# Patient Record
Sex: Female | Born: 1960 | Race: White | Hispanic: No | Marital: Married | Smoking: Never smoker
Health system: Southern US, Community
[De-identification: ages and names within clinical notes are randomized; demographics above are authoritative.]

## PROBLEM LIST (undated history)

## (undated) DIAGNOSIS — N289 Disorder of kidney and ureter, unspecified: Secondary | ICD-10-CM

## (undated) DIAGNOSIS — I10 Essential (primary) hypertension: Secondary | ICD-10-CM

## (undated) DIAGNOSIS — E785 Hyperlipidemia, unspecified: Secondary | ICD-10-CM

## (undated) DIAGNOSIS — I2510 Atherosclerotic heart disease of native coronary artery without angina pectoris: Secondary | ICD-10-CM

## (undated) DIAGNOSIS — R079 Chest pain, unspecified: Secondary | ICD-10-CM

---

## 2013-01-29 ENCOUNTER — Inpatient Hospital Stay: Payer: Self-pay | Admitting: Internal Medicine

## 2013-01-29 LAB — COMPREHENSIVE METABOLIC PANEL
Calcium, Total: 9.3 mg/dL (ref 8.5–10.1)
Chloride: 104 mmol/L (ref 98–107)
Co2: 28 mmol/L (ref 21–32)
Creatinine: 1.06 mg/dL (ref 0.60–1.30)
EGFR (African American): >60
EGFR (Non-African Amer.): >60
Osmolality: 280 (ref 275–301)
Potassium: 3.3 mmol/L — ABNORMAL LOW (ref 3.5–5.1)
SGPT (ALT): 60 U/L (ref 12–78)
Sodium: 139 mmol/L (ref 136–145)

## 2013-01-29 LAB — TSH: Thyroid Stimulating Horm: 0.496 u[IU]/mL

## 2013-01-29 LAB — URINALYSIS, COMPLETE
Bacteria: NONE SEEN
Bilirubin,UR: NEGATIVE
Glucose,UR: NEGATIVE mg/dL (ref 0–75)
Ketone: NEGATIVE
Leukocyte Esterase: NEGATIVE
Nitrite: NEGATIVE
Ph: 7 (ref 4.5–8.0)
Squamous Epithelial: <1

## 2013-01-29 LAB — TROPONIN I
Troponin-I: 0.03 ng/mL
Troponin-I: 0.07 ng/mL — ABNORMAL HIGH
Troponin-I: 0.47 ng/mL — ABNORMAL HIGH
Troponin-I: 1 ng/mL — ABNORMAL HIGH

## 2013-01-29 LAB — CBC
HCT: 36.6 % (ref 35.0–47.0)
MCHC: 31.2 g/dL — ABNORMAL LOW (ref 32.0–36.0)
MCV: 74 fL — ABNORMAL LOW (ref 80–100)
Platelet: 343 10*3/uL (ref 150–440)
RBC: 4.97 10*6/uL (ref 3.80–5.20)
RDW: 18 % — ABNORMAL HIGH (ref 11.5–14.5)
WBC: 11.5 10*3/uL — ABNORMAL HIGH (ref 3.6–11.0)

## 2013-01-29 LAB — CK-MB
CK-MB: 6.9 ng/mL — ABNORMAL HIGH (ref 0.5–3.6)
CK-MB: 7.5 ng/mL — ABNORMAL HIGH (ref 0.5–3.6)

## 2013-01-29 LAB — LIPID PANEL
Cholesterol: 119 mg/dL (ref 0–200)
HDL Cholesterol: 58 mg/dL (ref 40–60)
Triglycerides: 63 mg/dL (ref 0–200)

## 2013-01-29 LAB — HEMOGLOBIN A1C: Hemoglobin A1C: 5.7 % (ref 4.2–6.3)

## 2013-01-29 LAB — PRO B NATRIURETIC PEPTIDE: B-Type Natriuretic Peptide: 1601 pg/mL — ABNORMAL HIGH (ref 0–125)

## 2013-01-29 LAB — CK TOTAL AND CKMB (NOT AT ARMC): CK-MB: 4.3 ng/mL — ABNORMAL HIGH (ref 0.5–3.6)

## 2013-01-30 LAB — MAGNESIUM: Magnesium: 1.6 mg/dL — ABNORMAL LOW

## 2013-01-30 LAB — POTASSIUM: Potassium: 4.1 mmol/L (ref 3.5–5.1)

## 2013-01-30 LAB — OCCULT BLOOD X 1 CARD TO LAB, STOOL: Occult Blood, Feces: NEGATIVE

## 2013-01-31 LAB — CBC WITH DIFFERENTIAL/PLATELET
Basophil #: 0 10*3/uL (ref 0.0–0.1)
HCT: 29.2 % — ABNORMAL LOW (ref 35.0–47.0)
Lymphocyte %: 35 %
MCH: 23.4 pg — ABNORMAL LOW (ref 26.0–34.0)
MCHC: 32.1 g/dL (ref 32.0–36.0)
Monocyte %: 8 %
Neutrophil %: 51.3 %
RDW: 17.8 % — ABNORMAL HIGH (ref 11.5–14.5)
WBC: 6.9 10*3/uL (ref 3.6–11.0)

## 2013-02-01 HISTORY — PX: CARDIAC CATHETERIZATION: SHX172

## 2013-02-01 LAB — CBC WITH DIFFERENTIAL/PLATELET
Basophil #: 0.1 10*3/uL (ref 0.0–0.1)
Basophil %: 0.9 %
Eosinophil #: 0.3 10*3/uL (ref 0.0–0.7)
HGB: 10.4 g/dL — ABNORMAL LOW (ref 12.0–16.0)
MCHC: 32.9 g/dL (ref 32.0–36.0)
MCV: 73 fL — ABNORMAL LOW (ref 80–100)
Monocyte #: 0.6 x10 3/mm (ref 0.2–0.9)
Monocyte %: 9.1 %
Neutrophil #: 3.7 10*3/uL (ref 1.4–6.5)
Neutrophil %: 54.6 %
Platelet: 333 10*3/uL (ref 150–440)
RBC: 4.34 10*6/uL (ref 3.80–5.20)

## 2013-02-01 LAB — BASIC METABOLIC PANEL
Anion Gap: 4 — ABNORMAL LOW (ref 7–16)
BUN: 26 mg/dL — ABNORMAL HIGH (ref 7–18)
Chloride: 104 mmol/L (ref 98–107)
Co2: 29 mmol/L (ref 21–32)
Creatinine: 1.39 mg/dL — ABNORMAL HIGH (ref 0.60–1.30)
EGFR (African American): 51 — ABNORMAL LOW
Glucose: 89 mg/dL (ref 65–99)
Osmolality: 278 (ref 275–301)
Sodium: 137 mmol/L (ref 136–145)

## 2013-02-01 LAB — HCG, QUANTITATIVE, PREGNANCY: Beta Hcg, Quant.: <1 m[IU]/mL — ABNORMAL LOW

## 2013-02-02 LAB — BASIC METABOLIC PANEL
Calcium, Total: 9.3 mg/dL (ref 8.5–10.1)
Chloride: 102 mmol/L (ref 98–107)
Creatinine: 1.39 mg/dL — ABNORMAL HIGH (ref 0.60–1.30)
EGFR (African American): 51 — ABNORMAL LOW
Glucose: 95 mg/dL (ref 65–99)
Potassium: 4.6 mmol/L (ref 3.5–5.1)
Sodium: 136 mmol/L (ref 136–145)

## 2013-02-03 ENCOUNTER — Encounter (HOSPITAL_COMMUNITY): Payer: Self-pay | Admitting: Emergency Medicine

## 2013-02-03 ENCOUNTER — Emergency Department (HOSPITAL_COMMUNITY): Payer: Self-pay

## 2013-02-03 ENCOUNTER — Inpatient Hospital Stay (HOSPITAL_COMMUNITY)
Admission: EM | Admit: 2013-02-03 | Discharge: 2013-02-06 | DRG: 683 | Disposition: A | Discharge status: Home / Self Care | Payer: Self-pay | Attending: Cardiology | Admitting: Cardiology

## 2013-02-03 DIAGNOSIS — I10 Essential (primary) hypertension: Secondary | ICD-10-CM | POA: Diagnosis present

## 2013-02-03 DIAGNOSIS — N189 Chronic kidney disease, unspecified: Secondary | ICD-10-CM | POA: Diagnosis present

## 2013-02-03 DIAGNOSIS — N289 Disorder of kidney and ureter, unspecified: Secondary | ICD-10-CM | POA: Diagnosis present

## 2013-02-03 DIAGNOSIS — E785 Hyperlipidemia, unspecified: Secondary | ICD-10-CM | POA: Diagnosis present

## 2013-02-03 DIAGNOSIS — I2510 Atherosclerotic heart disease of native coronary artery without angina pectoris: Secondary | ICD-10-CM | POA: Diagnosis present

## 2013-02-03 DIAGNOSIS — I238 Other current complications following acute myocardial infarction: Secondary | ICD-10-CM | POA: Diagnosis present

## 2013-02-03 DIAGNOSIS — Z7982 Long term (current) use of aspirin: Secondary | ICD-10-CM

## 2013-02-03 DIAGNOSIS — Z9119 Patient's noncompliance with other medical treatment and regimen: Secondary | ICD-10-CM

## 2013-02-03 DIAGNOSIS — I214 Non-ST elevation (NSTEMI) myocardial infarction: Secondary | ICD-10-CM | POA: Diagnosis present

## 2013-02-03 DIAGNOSIS — I129 Hypertensive chronic kidney disease with stage 1 through stage 4 chronic kidney disease, or unspecified chronic kidney disease: Principal | ICD-10-CM | POA: Diagnosis present

## 2013-02-03 DIAGNOSIS — Z91199 Patient's noncompliance with other medical treatment and regimen due to unspecified reason: Secondary | ICD-10-CM

## 2013-02-03 DIAGNOSIS — Z79899 Other long term (current) drug therapy: Secondary | ICD-10-CM

## 2013-02-03 DIAGNOSIS — R079 Chest pain, unspecified: Secondary | ICD-10-CM | POA: Diagnosis present

## 2013-02-03 HISTORY — DX: Atherosclerotic heart disease of native coronary artery without angina pectoris: I25.10

## 2013-02-03 HISTORY — DX: Essential (primary) hypertension: I10

## 2013-02-03 HISTORY — DX: Disorder of kidney and ureter, unspecified: N28.9

## 2013-02-03 HISTORY — DX: Chest pain, unspecified: R07.9

## 2013-02-03 HISTORY — DX: Hyperlipidemia, unspecified: E78.5

## 2013-02-03 LAB — POCT I-STAT, CHEM 8
Calcium, Ion: 1.24 mmol/L — ABNORMAL HIGH (ref 1.12–1.23)
Creatinine, Ser: 1.6 mg/dL — ABNORMAL HIGH (ref 0.50–1.10)
HCT: 36 % (ref 36.0–46.0)
Hemoglobin: 12.2 g/dL (ref 12.0–15.0)
Potassium: 4 mEq/L (ref 3.5–5.1)
Sodium: 136 mEq/L (ref 135–145)

## 2013-02-03 NOTE — ED Notes (Signed)
According to EMS, she was treated at [facility] for a NSTEMI they cathed her and sent her home.  Was treated with Nitro and discharged.  Today she started having chest pain so she took 324mg  of Aspirin and 4 Nitro over an hour.  The first three helped and the last one did not so she called EMS.  EMS did 12LEad showed NSR, they placed an IV, #20 to the right forearm and gave her 4mg  of Nitro SL and it went form 6/10 to 3/10.  They transported her here.

## 2013-02-04 ENCOUNTER — Encounter (HOSPITAL_COMMUNITY): Payer: Self-pay | Admitting: Physician Assistant

## 2013-02-04 DIAGNOSIS — I10 Essential (primary) hypertension: Secondary | ICD-10-CM

## 2013-02-04 DIAGNOSIS — R079 Chest pain, unspecified: Secondary | ICD-10-CM | POA: Insufficient documentation

## 2013-02-04 DIAGNOSIS — I2510 Atherosclerotic heart disease of native coronary artery without angina pectoris: Secondary | ICD-10-CM | POA: Diagnosis present

## 2013-02-04 DIAGNOSIS — E785 Hyperlipidemia, unspecified: Secondary | ICD-10-CM | POA: Diagnosis present

## 2013-02-04 DIAGNOSIS — N289 Disorder of kidney and ureter, unspecified: Secondary | ICD-10-CM | POA: Diagnosis present

## 2013-02-04 LAB — CBC WITH DIFFERENTIAL/PLATELET
Basophils Absolute: 0 10*3/uL (ref 0.0–0.1)
Eosinophils Absolute: 0.2 10*3/uL (ref 0.0–0.7)
Eosinophils Relative: 2 % (ref 0–5)
Hemoglobin: 10.4 g/dL — ABNORMAL LOW (ref 12.0–15.0)
Lymphs Abs: 1.6 10*3/uL (ref 0.7–4.0)
MCH: 23.4 pg — ABNORMAL LOW (ref 26.0–34.0)
MCV: 75.1 fL — ABNORMAL LOW (ref 78.0–100.0)
Monocytes Absolute: 0.4 10*3/uL (ref 0.1–1.0)
Neutrophils Relative %: 77 % (ref 43–77)
Platelets: 406 10*3/uL — ABNORMAL HIGH (ref 150–400)
RBC: 4.45 MIL/uL (ref 3.87–5.11)
RDW: 17.2 % — ABNORMAL HIGH (ref 11.5–15.5)
WBC: 9.5 10*3/uL (ref 4.0–10.5)

## 2013-02-04 LAB — POCT I-STAT TROPONIN I: Troponin i, poc: 0.13 ng/mL (ref 0.00–0.08)

## 2013-02-04 LAB — CBC
HCT: 33.7 % — ABNORMAL LOW (ref 36.0–46.0)
MCHC: 31.8 g/dL (ref 30.0–36.0)
MCV: 74.1 fL — ABNORMAL LOW (ref 78.0–100.0)
Platelets: 381 10*3/uL (ref 150–400)
RBC: 4.55 MIL/uL (ref 3.87–5.11)
WBC: 8.5 10*3/uL (ref 4.0–10.5)

## 2013-02-04 LAB — BASIC METABOLIC PANEL
CO2: 24 mEq/L (ref 19–32)
Chloride: 104 mEq/L (ref 96–112)
GFR calc Af Amer: 60 mL/min — ABNORMAL LOW (ref >90)
Sodium: 138 mEq/L (ref 135–145)

## 2013-02-04 LAB — MRSA PCR SCREENING: MRSA by PCR: NEGATIVE

## 2013-02-04 LAB — PROTIME-INR
INR: 1.07 (ref 0.00–1.49)
Prothrombin Time: 13.7 seconds (ref 11.6–15.2)

## 2013-02-04 LAB — TROPONIN I: Troponin I: <0.3 ng/mL (ref <0.30)

## 2013-02-04 LAB — HEMOGLOBIN A1C: Hgb A1c MFr Bld: 5.6 % (ref <5.7)

## 2013-02-04 LAB — MAGNESIUM: Magnesium: 1.8 mg/dL (ref 1.5–2.5)

## 2013-02-04 MED ORDER — HYDROCHLOROTHIAZIDE 25 MG PO TABS
25.0000 mg | ORAL_TABLET | Freq: Every day | ORAL | Status: DC
  Filled 2013-02-04: qty 1

## 2013-02-04 MED ORDER — SODIUM CHLORIDE 0.9 % IJ SOLN: 3.0000 mL | INTRAMUSCULAR | Status: DC | PRN

## 2013-02-04 MED ORDER — LISINOPRIL 40 MG PO TABS: 40.0000 mg | ORAL_TABLET | Freq: Every day | ORAL | Status: DC

## 2013-02-04 MED ORDER — ACETAMINOPHEN 325 MG PO TABS: 650.0000 mg | ORAL_TABLET | ORAL | Status: DC | PRN

## 2013-02-04 MED ORDER — SODIUM CHLORIDE 0.9 % IV BOLUS (SEPSIS)
500.0000 mL | Freq: Once | INTRAVENOUS | Status: AC
  Administered 2013-02-04: 500 mL via INTRAVENOUS

## 2013-02-04 MED ORDER — SODIUM CHLORIDE 0.9 % IJ SOLN
3.0000 mL | Freq: Two times a day (BID) | INTRAMUSCULAR | Status: DC
  Administered 2013-02-04 – 2013-02-06 (×5): 3 mL via INTRAVENOUS

## 2013-02-04 MED ORDER — HYDRALAZINE HCL 20 MG/ML IJ SOLN
10.0000 mg | Freq: Four times a day (QID) | INTRAMUSCULAR | Status: DC | PRN
  Administered 2013-02-05: 10 mg via INTRAVENOUS
  Filled 2013-02-04: qty 1

## 2013-02-04 MED ORDER — PANTOPRAZOLE SODIUM 40 MG PO TBEC
40.0000 mg | DELAYED_RELEASE_TABLET | Freq: Every day | ORAL | Status: DC
  Administered 2013-02-04 – 2013-02-06 (×3): 40 mg via ORAL
  Filled 2013-02-04 (×3): qty 1

## 2013-02-04 MED ORDER — SODIUM CHLORIDE 0.9 % IV SOLN: 250.0000 mL | INTRAVENOUS | Status: DC | PRN

## 2013-02-04 MED ORDER — HEPARIN SODIUM (PORCINE) 5000 UNIT/ML IJ SOLN
5000.0000 [IU] | Freq: Three times a day (TID) | INTRAMUSCULAR | Status: DC
  Administered 2013-02-04 – 2013-02-06 (×7): 5000 [IU] via SUBCUTANEOUS
  Filled 2013-02-04 (×11): qty 1

## 2013-02-04 MED ORDER — LISINOPRIL 40 MG PO TABS
40.0000 mg | ORAL_TABLET | Freq: Every day | ORAL | Status: DC
  Filled 2013-02-04: qty 1

## 2013-02-04 MED ORDER — LISINOPRIL-HYDROCHLOROTHIAZIDE 20-12.5 MG PO TABS: 2.0000 | ORAL_TABLET | Freq: Every day | ORAL | Status: DC

## 2013-02-04 MED ORDER — GI COCKTAIL ~~LOC~~
30.0000 mL | Freq: Once | ORAL | Status: AC
  Administered 2013-02-04: 30 mL via ORAL
  Filled 2013-02-04: qty 30

## 2013-02-04 MED ORDER — ATORVASTATIN CALCIUM 20 MG PO TABS
20.0000 mg | ORAL_TABLET | Freq: Every day | ORAL | Status: DC
  Administered 2013-02-04 – 2013-02-06 (×3): 20 mg via ORAL
  Filled 2013-02-04 (×3): qty 1

## 2013-02-04 MED ORDER — FENTANYL CITRATE 0.05 MG/ML IJ SOLN
50.0000 ug | Freq: Once | INTRAMUSCULAR | Status: AC
  Administered 2013-02-04: 50 ug via INTRAVENOUS
  Filled 2013-02-04: qty 2

## 2013-02-04 MED ORDER — METOPROLOL TARTRATE 25 MG PO TABS
25.0000 mg | ORAL_TABLET | Freq: Two times a day (BID) | ORAL | Status: DC
  Administered 2013-02-04 (×2): 25 mg via ORAL
  Filled 2013-02-04 (×4): qty 1

## 2013-02-04 MED ORDER — ASPIRIN 325 MG PO TABS
325.0000 mg | ORAL_TABLET | Freq: Every day | ORAL | Status: DC
  Administered 2013-02-04 – 2013-02-06 (×3): 325 mg via ORAL
  Filled 2013-02-04 (×3): qty 1

## 2013-02-04 MED ORDER — HYDRALAZINE HCL 20 MG/ML IJ SOLN
5.0000 mg | Freq: Once | INTRAMUSCULAR | Status: AC
  Administered 2013-02-04: 5 mg via INTRAVENOUS
  Filled 2013-02-04: qty 1

## 2013-02-04 MED ORDER — HYDROCHLOROTHIAZIDE 25 MG PO TABS: 25.0000 mg | ORAL_TABLET | Freq: Every day | ORAL | Status: DC

## 2013-02-04 MED ORDER — ONDANSETRON HCL 4 MG/2ML IJ SOLN: 4.0000 mg | Freq: Four times a day (QID) | INTRAMUSCULAR | Status: DC | PRN

## 2013-02-04 MED ORDER — ALPRAZOLAM 0.25 MG PO TABS: 0.2500 mg | ORAL_TABLET | Freq: Two times a day (BID) | ORAL | Status: DC | PRN

## 2013-02-04 MED ORDER — LISINOPRIL 20 MG PO TABS
20.0000 mg | ORAL_TABLET | Freq: Every day | ORAL | Status: DC
  Administered 2013-02-04 – 2013-02-06 (×3): 20 mg via ORAL
  Filled 2013-02-04 (×3): qty 1

## 2013-02-04 MED ORDER — NITROGLYCERIN IN D5W 200-5 MCG/ML-% IV SOLN
2.0000 ug/min | INTRAVENOUS | Status: DC
  Administered 2013-02-04: 2 ug/min via INTRAVENOUS
  Filled 2013-02-04: qty 250

## 2013-02-04 MED ORDER — AMLODIPINE BESYLATE 5 MG PO TABS
5.0000 mg | ORAL_TABLET | Freq: Every day | ORAL | Status: DC
  Administered 2013-02-04 – 2013-02-06 (×3): 5 mg via ORAL
  Filled 2013-02-04 (×3): qty 1

## 2013-02-04 NOTE — ED Notes (Signed)
I stat troponin results given to Dr. Nicanor Alcon by B. Bing Plume, EMT

## 2013-02-04 NOTE — ED Notes (Signed)
Family at bedside. 

## 2013-02-04 NOTE — Progress Notes (Signed)
Patient Name: [name] Date of Encounter: 02/04/2013   Active Problems:   Chest pain   Renal insufficiency   Uncontrolled hypertension   CAD (coronary artery disease)   Hyperlipidemia    SUBJECTIVE  No chest pain. On nitro ggt. No SOB, diaphoresis, abdominal pain, n/v.  Admits to lightheadedness and dizziness.  CURRENT MEDS . aspirin  325 mg Oral Daily  . atorvastatin  20 mg Oral Daily  . heparin  5,000 Units Subcutaneous Q8H  . lisinopril  40 mg Oral Daily   And  . hydrochlorothiazide  25 mg Oral Daily  . metoprolol tartrate  25 mg Oral BID  . pantoprazole  40 mg Oral Daily  . sodium chloride  3 mL Intravenous Q12H    OBJECTIVE  Filed Vitals:   02/04/13 0600 02/04/13 0700 02/04/13 0715 02/04/13 0723  BP: 154/79 145/97 169/77   Pulse: 80 78 77   Temp:    99.2 F (37.3 C)  TempSrc:    Oral  Resp: 15 14 12    Height:      Weight:      SpO2: 99% 95% 96%     Intake/Output Summary (Last 24 hours) at 02/04/13 0754 Last data filed at 02/04/13 0746  Gross per 24 hour  Intake      3 ml  Output    600 ml  Net   -597 ml   Filed Weights   02/04/13 0311  Weight: 151 lb 14.4 oz (68.9 kg)    PHYSICAL EXAM  General appearance: alert, cooperative, appears stated age and mild distress  Head: Normocephalic, without obvious abnormality, atraumatic  Eyes: conjunctivae/corneas clear. PERRL, EOM's intact. Fundi benign.  Neck: no carotid bruit, no JVD and supple, symmetrical, trachea midline  Lungs: clear to auscultation bilaterally  Chest wall: no tenderness  Heart: regular rate and rhythm, S1, S2 normal, no murmurs or rubs detected  Abdomen: soft, non-tender; bowel sounds normal; no masses, no organomegaly  Extremities: extremities normal, atraumatic, no cyanosis or edema  Pulses: 2+ and symmetric  Neurologic: Grossly normal  Accessory Clinical Findings  CBC  Recent Labs  02/03/13 2347 02/03/13 2352 02/04/13 0649  WBC 9.5  --  8.5  NEUTROABS 7.3  --    --   HGB 10.4* 12.2 10.7*  HCT 33.4* 36.0 33.7*  MCV 75.1*  --  74.1*  PLT 406*  --  381   Basic Metabolic Panel  Recent Labs  02/03/13 2352  NA 136  K 4.0  CL 98  GLUCOSE 122*  BUN 24*  CREATININE 1.60*   Liver Function Tests No results found for this basename: AST, ALT, ALKPHOS, BILITOT, PROT, ALBUMIN,  in the last 72 hours No results found for this basename: LIPASE, AMYLASE,  in the last 72 hours Cardiac Enzymes  Recent Labs  02/04/13 0245  TROPONINI <0.30   TELE  NSR HR 81  ECG  HR 84, prolonged Qt, non specific T wave abnormality.   Radiology/Studies  Dg Chest 2 View  02/04/2013   CLINICAL DATA:  Mid chest pain; recent cardiac catheterization.  EXAM: CHEST  2 VIEW  COMPARISON:  Chest radiograph performed 01/29/2013  FINDINGS: The lungs are well-aerated and clear. There is no evidence of focal opacification, pleural effusion or pneumothorax.  The heart is borderline enlarged; the mediastinal contour is within normal limits. No acute osseous abnormalities are seen.  IMPRESSION: No acute cardiopulmonary process seen; borderline cardiomegaly.    ASSESSMENT AND PLAN Kaedence Connelly is a 52  y.o. female who presented last night for evaluation of chest pain. She was discharged 3 days ago from [facility] after NSTEMI on 12/8. She had cardiac catherization revealing mid LAD 50%, otherwise all vessels patent. She presented to ED last night complaining of chest pain and was found to be in hypertensive urgency (highest BP recorded 205/107).   1. Chest pain- none currently -- Troponin neg x1, continue to cycle -- Recent NSTEMI with Cath on 02/01/13 with 50% stenosis in LAD, otherwise patent vessels -- EKG with no acute ST or TW changes -- On nitro gtt for hypertensive urgency  -- Monitor on telemetry  -- Continue statin and aspirin  2. Hypertensive urgency  -- BP still elevated 196/89, ordered hydralazine IV 10 q6h for systolic BP >170 mm Hg -- On NTG gtt. Wean off  when stabilized on home meds -- She has a history of uncontrolled htn due to non-compliance (2 years). Stressed importance of taking her medicines  -- Lisinopril/HCTZ on hold for now due to elevated Cr.  3. Renal insufficiency (Cr 1.6) -- Hydrate and continue to monitor renal function -- Lisinopril/HCTZ on hold for now  4. HLD -- Continue statin  Signed, STERN, KATHRYN PA-C  History and all data above reviewed.  Patient examined.  I agree with the findings as above.  Minimal chest pain now. No objective evidence of ischemia.  I reviewed the EKG.  he patient exam reveals COR:RRR  ,  Lungs: Clear  ,  Abd: Positive bowel sounds, no rebound no guarding, Ext No edema  .  All available labs, radiology testing, previous records reviewed. Agree with documented assessment and plan. Chest pain:  I do not suspect an ACS and no repeat cath is indicated.  I will review the records and cath report from [place].  HTN:  She reports that she was taking her meds.  She says she has never had a work up of secondary causes.  I will check aldosterone level, VMA and metanephrines.  I will order renal artery Doppler to exclude something like fibromuscular dysplasia.   I will restart ACE without HCTZ.  I will start Norvasc.  Follow BMET with mildly elevated creat on admission.    Rollene Rotunda  9:50 AM  02/04/2013

## 2013-02-04 NOTE — Progress Notes (Signed)
*  PRELIMINARY RESULTS* Vascular Ultrasound Renal Artery Duplex has been completed.  Preliminary findings:   No obvious evidence of renal artery stenosis bilaterally. Symmetrical kidney size.   Farrel Demark, RDMS, RVT  02/04/2013, 3:29 PM

## 2013-02-04 NOTE — H&P (Signed)
Cardiology History and Physical  No PCP Per Patient  History of Present Illness (and review of medical records): [name] is a 52 y.o. female who presents for evaluation of chest pain.  Of note patient was recent discharged from [facility] after NSTEMI 12/8.  She had cardiac catherization revealing mid LAD 50%, otherwise all vessels patent.  She had 2D Echo revealing EF 50-55%, LVH, grade 1 diastolic dysfunction, mild TR and septal HK.  She has of poorly controlled HTN and HLD.  She developed severe chest pain around 930pm.  Pain was midsternal and then radiated to across to both sides.  Pain was 11/10.  She took 3 NTG at home with no relief.  EMS was called and she then took ASA.  She had NTG again by EMS.  Upon arrival BP with systolics 190s.  Pain is now 2/10 and BP remains high after Hydralazine, fentanyl, and GI cocktail.  POC  Troponin is 0.13.  Previous diagnostic testing for coronary artery disease includes: cardiac catheterization and echocardiogram. Previous history of cardiac disease includes Chest Pain Coronary Artery Disease MI. Coronary artery disease risk factors include: dyslipidemia and hypertension. Patient denies history of coronary artery stent.  Review of Systems Pertinent items are noted in HPI. Further review of systems was otherwise negative other than stated in HPI.  Patient Active Problem List   Diagnosis Date Noted  . Chest pain with high risk for cardiac etiology 02/04/2013   Past Medical History  Diagnosis Date  . Hypertension   . Hyperlipidemia     Past Surgical History  Procedure Laterality Date  . Cardiac catheterization  02/01/2013    Prescriptions prior to admission  Medication Sig Dispense Refill  . aspirin 325 MG tablet Take 325 mg by mouth daily.      Marland Kitchen atorvastatin (LIPITOR) 20 MG tablet Take 20 mg by mouth daily.      Marland Kitchen esomeprazole (NEXIUM) 20 MG capsule Take 20 mg by mouth daily at 12 noon.      Marland Kitchen lisinopril-hydrochlorothiazide  (PRINZIDE,ZESTORETIC) 20-12.5 MG per tablet Take 2 tablets by mouth daily.      . metoprolol tartrate (LOPRESSOR) 25 MG tablet Take 25 mg by mouth 2 (two) times daily.      . nitroGLYCERIN (NITROSTAT) 0.4 MG SL tablet Place 0.4 mg under the tongue every 5 (five) minutes as needed for chest pain.      Marland Kitchen triamcinolone (NASACORT ALLERGY 24HR) 55 MCG/ACT AERO nasal inhaler Place 2 sprays into the nose daily.       Allergies  Allergen Reactions  . Cephalexin     hives    History  Substance Use Topics  . Smoking status: Never Smoker   . Smokeless tobacco: Never Used  . Alcohol Use: No    History reviewed. No pertinent family history.   Objective:  Patient Vitals for the past 8 hrs:  BP Temp Temp src Pulse Resp SpO2 Height Weight  02/04/13 0311 167/79 mmHg - - 81 18 99 % 5' (1.524 m) 68.9 kg (151 lb 14.4 oz)  02/04/13 0300 161/89 mmHg 98.1 F (36.7 C) Oral 78 - - - -  02/04/13 0246 132/74 mmHg - - - - - - -  02/04/13 0246 142/82 mmHg - - - - - - -  02/04/13 0219 88/45 mmHg - - 65 11 97 % - -  02/04/13 0215 79/46 mmHg - - 65 13 95 % - -  02/04/13 0200 136/65 mmHg - - 71 14 95 % - -  02/04/13 0154 148/84 mmHg - - 72 9 95 % - -  02/04/13 0130 194/99 mmHg - - 75 19 98 % - -  02/04/13 0120 169/89 mmHg - - 69 22 93 % - -  02/04/13 0115 169/89 mmHg - - 71 18 94 % - -  02/04/13 0100 184/82 mmHg - - 68 14 96 % - -  02/04/13 0045 186/94 mmHg - - 77 18 92 % - -  02/04/13 0030 183/84 mmHg - - 72 - 94 % - -  02/04/13 0015 176/92 mmHg - - 72 - 94 % - -  02/04/13 0000 205/107 mmHg - - 80 - 100 % - -  02/03/13 2330 178/87 mmHg - - 75 16 95 % - -  02/03/13 2311 - - - - - 100 % - -   General appearance: alert, cooperative, appears stated age and mild distress Head: Normocephalic, without obvious abnormality, atraumatic Eyes: conjunctivae/corneas clear. PERRL, EOM's intact. Fundi benign. Neck: no carotid bruit, no JVD and supple, symmetrical, trachea midline Lungs: clear to auscultation  bilaterally Chest wall: no tenderness Heart: regular rate and rhythm, S1, S2 normal, no murmurs or rubs detected Abdomen: soft, non-tender; bowel sounds normal; no masses,  no organomegaly Extremities: extremities normal, atraumatic, no cyanosis or edema Pulses: 2+ and symmetric Neurologic: Grossly normal  Results for orders placed during the hospital encounter of 02/03/13 (from the past 48 hour(s))  CBC WITH DIFFERENTIAL     Status: Abnormal   Collection Time    02/03/13 11:47 PM      Result Value Range   WBC 9.5  4.0 - 10.5 K/uL   RBC 4.45  3.87 - 5.11 MIL/uL   Hemoglobin 10.4 (*) 12.0 - 15.0 g/dL   HCT 96.0 (*) 45.4 - 09.8 %   MCV 75.1 (*) 78.0 - 100.0 fL   MCH 23.4 (*) 26.0 - 34.0 pg   MCHC 31.1  30.0 - 36.0 g/dL   RDW 11.9 (*) 14.7 - 82.9 %   Platelets 406 (*) 150 - 400 K/uL   Neutrophils Relative % 77  43 - 77 %   Neutro Abs 7.3  1.7 - 7.7 K/uL   Lymphocytes Relative 17  12 - 46 %   Lymphs Abs 1.6  0.7 - 4.0 K/uL   Monocytes Relative 5  3 - 12 %   Monocytes Absolute 0.4  0.1 - 1.0 K/uL   Eosinophils Relative 2  0 - 5 %   Eosinophils Absolute 0.2  0.0 - 0.7 K/uL   Basophils Relative 0  0 - 1 %   Basophils Absolute 0.0  0.0 - 0.1 K/uL  POCT I-STAT, CHEM 8     Status: Abnormal   Collection Time    02/03/13 11:52 PM      Result Value Range   Sodium 136  135 - 145 mEq/L   Potassium 4.0  3.5 - 5.1 mEq/L   Chloride 98  96 - 112 mEq/L   BUN 24 (*) 6 - 23 mg/dL   Creatinine, Ser 5.62 (*) 0.50 - 1.10 mg/dL   Glucose, Bld 130 (*) 70 - 99 mg/dL   Calcium, Ion 8.65 (*) 1.12 - 1.23 mmol/L   TCO2 24  0 - 100 mmol/L   Hemoglobin 12.2  12.0 - 15.0 g/dL   HCT 78.4  69.6 - 29.5 %  POCT I-STAT TROPONIN I     Status: Abnormal   Collection Time    02/03/13 11:52 PM  Result Value Range   Troponin i, poc 0.13 (*) 0.00 - 0.08 ng/mL   Comment NOTIFIED PHYSICIAN     Comment 3            Comment: Due to the release kinetics of cTnI,     a negative result within the first hours      of the onset of symptoms does not rule out     myocardial infarction with certainty.     If myocardial infarction is still suspected,     repeat the test at appropriate intervals.  TROPONIN I     Status: None   Collection Time    02/04/13  2:45 AM      Result Value Range   Troponin I <0.30  <0.30 ng/mL   Comment:            Due to the release kinetics of cTnI,     a negative result within the first hours     of the onset of symptoms does not rule out     myocardial infarction with certainty.     If myocardial infarction is still suspected,     repeat the test at appropriate intervals.   Dg Chest 2 View  02/04/2013   CLINICAL DATA:  Mid chest pain; recent cardiac catheterization.  EXAM: CHEST  2 VIEW  COMPARISON:  Chest radiograph performed 01/29/2013  FINDINGS: The lungs are well-aerated and clear. There is no evidence of focal opacification, pleural effusion or pneumothorax.  The heart is borderline enlarged; the mediastinal contour is within normal limits. No acute osseous abnormalities are seen.  IMPRESSION: No acute cardiopulmonary process seen; borderline cardiomegaly.   Electronically Signed   By: Roanna Raider M.D.   On: 02/04/2013 00:38    ECG:  Sinus rhythm HR 76, LVH, no prior to compare, no acute ischemic changes  Assessment: Chest pain, recent NSTEMI s/p cath with mid LAD 50% HTN urgency HLD Renal insufficiency  Plan: 1. Cardiology Admission  2. Continuous monitoring on Telemetry. 3. Repeat ekg on admit, prn chest pain or arrythmia 4. Trend cardiac biomarkers 5. BP control with NTG gtt.  Add home meds and wean off as tolerated. 6. Continue ASA, Statin. No anticoagulation at this time. 7.  Monitor renal function

## 2013-02-04 NOTE — ED Provider Notes (Signed)
CSN: [id number]     Arrival date & time 02/03/13  2311 History   First MD Initiated Contact with Patient 02/03/13 2333     Chief Complaint  Patient presents with  . Chest Pain   (Consider location/radiation/quality/duration/timing/severity/associated sxs/prior Treatment) Patient is a 52 y.o. female presenting with chest pain. The history is provided by the patient.  Chest Pain Pain location:  Substernal area Pain quality: dull   Pain radiates to:  Does not radiate Pain severity:  Moderate Onset quality:  Gradual Timing:  Intermittent Progression:  Unchanged Chronicity:  Recurrent Context: at rest   Relieved by:  Nothing Ineffective treatments:  Nitroglycerin Associated symptoms: shortness of breath   Associated symptoms: no fever, no palpitations and not vomiting     Past Medical History  Diagnosis Date  . Hypertension   . Hyperlipidemia    Past Surgical History  Procedure Laterality Date  . Cardiac catheterization  02/01/2013   History reviewed. No pertinent family history. History  Substance Use Topics  . Smoking status: Never Smoker   . Smokeless tobacco: Never Used  . Alcohol Use: No   OB History   Grav Para Term Preterm Abortions TAB SAB Ect Mult Living                 Review of Systems  Constitutional: Negative for fever.  Respiratory: Positive for shortness of breath.   Cardiovascular: Positive for chest pain. Negative for palpitations and leg swelling.  Gastrointestinal: Negative for vomiting.  All other systems reviewed and are negative.    Allergies  Cephalexin  Home Medications   Current Outpatient Rx  Name  Route  Sig  Dispense  Refill  . aspirin 325 MG tablet   Oral   Take 325 mg by mouth daily.         Marland Kitchen atorvastatin (LIPITOR) 20 MG tablet   Oral   Take 20 mg by mouth daily.         Marland Kitchen esomeprazole (NEXIUM) 20 MG capsule   Oral   Take 20 mg by mouth daily at 12 noon.         Marland Kitchen lisinopril-hydrochlorothiazide  (PRINZIDE,ZESTORETIC) 20-12.5 MG per tablet   Oral   Take 2 tablets by mouth daily.         . metoprolol tartrate (LOPRESSOR) 25 MG tablet   Oral   Take 25 mg by mouth 2 (two) times daily.         . nitroGLYCERIN (NITROSTAT) 0.4 MG SL tablet   Sublingual   Place 0.4 mg under the tongue every 5 (five) minutes as needed for chest pain.         Marland Kitchen triamcinolone (NASACORT ALLERGY 24HR) 55 MCG/ACT AERO nasal inhaler   Nasal   Place 2 sprays into the nose daily.          BP 169/89  Pulse 69  Resp 22  SpO2 93%  LMP 01/28/2013 Physical Exam  Constitutional: She is oriented to person, place, and time. She appears well-developed and well-nourished.  HENT:  Head: Normocephalic and atraumatic.  Mouth/Throat: Oropharynx is clear and moist.  Eyes: Conjunctivae are normal. Pupils are equal, round, and reactive to light.  Neck: Normal range of motion. Neck supple.  Cardiovascular: Normal rate, regular rhythm and intact distal pulses.   Pulmonary/Chest: Effort normal and breath sounds normal. She has no wheezes. She has no rales.  Abdominal: Soft. Bowel sounds are normal. There is no tenderness. There is no rebound and no guarding.  Musculoskeletal: Normal range of motion.  Neurological: She is alert and oriented to person, place, and time.  Skin: Skin is warm and dry. She is not diaphoretic.  Psychiatric: She has a normal mood and affect.    ED Course  Procedures (including critical care time) Labs Review Labs Reviewed  CBC WITH DIFFERENTIAL - Abnormal; Notable for the following:    Hemoglobin 10.4 (*)    HCT 33.4 (*)    MCV 75.1 (*)    MCH 23.4 (*)    RDW 17.2 (*)    Platelets 406 (*)    All other components within normal limits  POCT I-STAT, CHEM 8 - Abnormal; Notable for the following:    BUN 24 (*)    Creatinine, Ser 1.60 (*)    Glucose, Bld 122 (*)    Calcium, Ion 1.24 (*)    All other components within normal limits  POCT I-STAT TROPONIN I - Abnormal; Notable for  the following:    Troponin i, poc 0.13 (*)    All other components within normal limits   Imaging Review Dg Chest 2 View  02/04/2013   CLINICAL DATA:  Mid chest pain; recent cardiac catheterization.  EXAM: CHEST  2 VIEW  COMPARISON:  Chest radiograph performed 01/29/2013  FINDINGS: The lungs are well-aerated and clear. There is no evidence of focal opacification, pleural effusion or pneumothorax.  The heart is borderline enlarged; the mediastinal contour is within normal limits. No acute osseous abnormalities are seen.  IMPRESSION: No acute cardiopulmonary process seen; borderline cardiomegaly.   Electronically Signed   By: Roanna Raider M.D.   On: 02/04/2013 00:38    EKG Interpretation   None       MDM   1. Chest pain   2. HTN (hypertension), malignant      Date: 02/04/2013  Rate: 76  Rhythm: normal sinus rhythm  QRS Axis: normal  Intervals: normal  ST/T Wave abnormalities: normal  Conduction Disutrbances: none  Narrative Interpretation: borderline LVH     Janea Schwenn K Adryel Wortmann-Rasch, MD 02/04/13 [phone]

## 2013-02-05 LAB — BASIC METABOLIC PANEL
BUN: 26 mg/dL — ABNORMAL HIGH (ref 6–23)
CO2: 24 mEq/L (ref 19–32)
Calcium: 9.4 mg/dL (ref 8.4–10.5)
Chloride: 102 mEq/L (ref 96–112)
Glucose, Bld: 93 mg/dL (ref 70–99)
Potassium: 4 mEq/L (ref 3.5–5.1)
Sodium: 136 mEq/L (ref 135–145)

## 2013-02-05 MED ORDER — METOPROLOL SUCCINATE ER 100 MG PO TB24
100.0000 mg | ORAL_TABLET | Freq: Every day | ORAL | Status: DC
  Administered 2013-02-05 – 2013-02-06 (×2): 100 mg via ORAL
  Filled 2013-02-05 (×2): qty 1

## 2013-02-05 NOTE — Progress Notes (Signed)
   SUBJECTIVE:  A little chest pain last night.  No SOB.     PHYSICAL EXAM Filed Vitals:   02/05/13 0000 02/05/13 0200 02/05/13 0400 02/05/13 0605  BP: 128/73 148/75 157/78 178/95  Pulse: 66     Temp: 98.2 F (36.8 C)     TempSrc: Oral     Resp: 16 19 14 12   Height:      Weight:      SpO2: 95%  95%    General:  No distress Lungs:  Clear Heart:  RRR Abdomen:  Positive bowel sounds, no rebound no guarding Extremities:  No edema.    LABS: Lab Results  Component Value Date   TROPONINI <0.30 02/04/2013   Results for orders placed during the hospital encounter of 02/03/13 (from the past 24 hour(s))  TROPONIN I     Status: None   Collection Time    02/04/13  8:25 AM      Result Value Range   Troponin I <0.30  <0.30 ng/mL  HEMOGLOBIN A1C     Status: None   Collection Time    02/04/13  8:25 AM      Result Value Range   Hemoglobin A1C 5.6  <5.7 %   Mean Plasma Glucose 114  <117 mg/dL  BASIC METABOLIC PANEL     Status: Abnormal   Collection Time    02/05/13  5:33 AM      Result Value Range   Sodium 136  135 - 145 mEq/L   Potassium 4.0  3.5 - 5.1 mEq/L   Chloride 102  96 - 112 mEq/L   CO2 24  19 - 32 mEq/L   Glucose, Bld 93  70 - 99 mg/dL   BUN 26 (*) 6 - 23 mg/dL   Creatinine, Ser 1.61 (*) 0.50 - 1.10 mg/dL   Calcium 9.4  8.4 - 09.6 mg/dL   GFR calc non Af Amer 45 (*) >90 mL/min   GFR calc Af Amer 53 (*) >90 mL/min    Intake/Output Summary (Last 24 hours) at 02/05/13 0731 Last data filed at 02/05/13 0000  Gross per 24 hour  Intake 824.01 ml  Output   2100 ml  Net -1275.99 ml    EKG:  Sinus rhythm, rate 79, axis within normal limits, intervals within normal limits, no acute ST-T wave changes.  02/05/2013   ASSESSMENT AND PLAN:  CHEST PAIN:  Ruled out.  No acute EKG changes.  No further invasive or noninvasive evaluation is planned.  I did review the [place] cath report.  I will ask for a copy of the CT done in  at the recent visit to make sure that  they excluded dissection.   HYPERTENSIVE URGENCY:  Normal renal Doppler.  24 hour urine in process.  BP is better but still elevated.  Norvasc was added yesterday.  NTG IV is off.  I will consolidate and increase the beta blocker.    CKD:   Creat is slightly elevated.  Watch closely on current meds.  Repeat BMET in the AM.  Avoiding HCTZ.    Rollene Rotunda 02/05/2013 7:31 AM

## 2013-02-05 NOTE — Care Management Note (Signed)
    Page 1 of 1   02/05/2013     11:23:06 AM   CARE MANAGEMENT NOTE 02/05/2013  Patient:  [facility]   Account Number:  [id number]  Date Initiated:  02/04/2013  Documentation initiated by:  Junius Creamer  Subjective/Objective Assessment:   adm w ch pain, pos troponin     Action/Plan:   lives w husband   Anticipated DC Date:     Anticipated DC Plan:        DC Planning Services  CM consult  [facility]      Choice offered to / List presented to:             Status of service:   Medicare Important Message given?   (If response is "NO", the following Medicare IM given date fields will be blank) Date Medicare IM given:   Date Additional Medicare IM given:    Discharge Disposition:  HOME/SELF CARE  Per UR Regulation:  Reviewed for med. necessity/level of care/duration of stay  If discussed at Long Length of Stay Meetings, dates discussed:    Comments:  12/12 1121a debbie Tamyia Minich rn,bsn spoke w pt. she lives in [place] she has inform on [facility] but hasn't had time to sched appt but plans to. she just got job at [employer] and can get meds at discount rate. has 2 da age 34 and 2 that are supportive.

## 2013-02-06 MED ORDER — AMLODIPINE BESYLATE 5 MG PO TABS: 5.0000 mg | ORAL_TABLET | Freq: Every day | ORAL | Status: AC

## 2013-02-06 MED ORDER — METOPROLOL SUCCINATE ER 100 MG PO TB24: 100.0000 mg | ORAL_TABLET | Freq: Every day | ORAL | Status: AC

## 2013-02-06 MED ORDER — LISINOPRIL 20 MG PO TABS: 20.0000 mg | ORAL_TABLET | Freq: Every day | ORAL | Status: AC

## 2013-02-06 NOTE — Progress Notes (Signed)
Patient ID: [name], female   DOB: September 10, 1960, 52 y.o.   MRN: [id number]   SUBJECTIVE:  A little chest pain last night.  No SOB.     PHYSICAL EXAM Filed Vitals:   02/05/13 1527 02/05/13 2100 02/06/13 0525 02/06/13 0940  BP: 157/75 115/65 154/89 156/89  Pulse:  71 68 73  Temp: 98.5 F (36.9 C) 98.4 F (36.9 C) 98.5 F (36.9 C)   TempSrc: Oral Oral Oral   Resp: 17 16 16    Height:      Weight:      SpO2: 97% 96% 100%    General:  No distress Lungs:  Clear Heart:  RRR Abdomen:  Positive bowel sounds, no rebound no guarding Extremities:  No edema.    LABS: Lab Results  Component Value Date   TROPONINI <0.30 02/04/2013   No results found for this or any previous visit (from the past 24 hour(s)).  Intake/Output Summary (Last 24 hours) at 02/06/13 1139 Last data filed at 02/06/13 0943  Gross per 24 hour  Intake    243 ml  Output   2100 ml  Net  -1857 ml    EKG:  Sinus rhythm, rate 79, axis within normal limits, intervals within normal limits, no acute ST-T wave changes.  02/06/2013   ASSESSMENT AND PLAN:  CHEST PAIN:  Ruled out.  No acute EKG changes.  No further invasive or noninvasive evaluation is planned. Per Dr Antoine Poche HYPERTENSIVE URGENCY:  Normal renal Doppler.  24 hour urine in process.  BP is better Meds adjusted by [facility]  CKD:   Creat is slightly elevated. 1.33   Diuretic held and will not go home on HCTZ for now  D/C outpatient f/u next availble with [facility].    Charlton Haws 02/06/2013 11:39 AM

## 2013-02-06 NOTE — Discharge Summary (Signed)
Physician Discharge Summary  Patient ID: [name] MRN: [id number] DOB/AGE: 51-May-1962 51 y.o.  Admit date: 02/03/2013 Discharge date: 02/06/2013  Primary Discharge Diagnosis: 1.Chest Pain 2. Hypertensive Urgency 3. CKD  Significant Diagnostic Studies: None  Hospital Course: Mr. Brinson is a 52 year old patient of Dr. Sarina Ser with known history of CAD status post recent non-ST elevation MI with cath in December of 2014 demonstrating a 50% stenosis in the LAD, with otherwise patent vessels. The patient developed severe chest pain around 9:30 PM described as midsternal and radiating to both sides, and presented to the emergency room in hypertensive urgency with blood pressure systolically greater than 190. The patient was admitted for blood pressure control and to rule out MI.   Cardiac markers were cycled and were found to be negative, ruling out ACS. The patient had renal artery duplex study completed which was normal with no wall is evidence of renal artery stenosis bilaterally symmetrical kidney size on 02/04/2013. A 24-hour urine was started during hospitalization with results pending. Creatinine was slightly elevated 1.33. Her diuretic was held. She will not be discharged on HCTZ. Followup with Dr. Del Rey Lions on next available. The patient was seen and examined by Dr. Eden Emms on discharge date, and found to be stable, blood pressure was normalized at 156/89. She was without complaint of recurrent chest pain.   Discharge Exam: Blood pressure 156/89, pulse 73, temperature 98.5 F (36.9 C), temperature source Oral, resp. rate 16, height 5' (1.524 m), weight 151 lb 14.4 oz (68.9 kg), last menstrual period 01/28/2013, SpO2 100.00%.  Labs:   Lab Results  Component Value Date   WBC 8.5 02/04/2013   HGB 10.7* 02/04/2013   HCT 33.7* 02/04/2013   MCV 74.1* 02/04/2013   PLT 381 02/04/2013     Recent Labs Lab 02/05/13 0533  NA 136  K 4.0  CL 102  CO2 24  BUN 26*    CREATININE 1.33*  CALCIUM 9.4  GLUCOSE 93   Lab Results  Component Value Date   TROPONINI <0.30 02/04/2013            Radiology: Dg Chest 2 View  02/04/2013   CLINICAL DATA:  Mid chest pain; recent cardiac catheterization.  EXAM: CHEST  2 VIEW  COMPARISON:  Chest radiograph performed 01/29/2013  FINDINGS: The lungs are well-aerated and clear. There is no evidence of focal opacification, pleural effusion or pneumothorax.  The heart is borderline enlarged; the mediastinal contour is within normal limits. No acute osseous abnormalities are seen.  IMPRESSION: No acute cardiopulmonary process seen; borderline cardiomegaly.   Electronically Signed   By: Roanna Raider M.D.   On: 02/04/2013 00:38    WUJ:WJXBJY sinus rhythm rate of 79 bpm. Nonspecific T wave abnormality  FOLLOW UP PLANS AND APPOINTMENTS     Discharge Orders   Future Orders Complete By Expires   Diet - low sodium heart healthy  As directed    Increase activity slowly  As directed        Medication List    STOP taking these medications       lisinopril-hydrochlorothiazide 20-12.5 MG per tablet  Commonly known as:  PRINZIDE,ZESTORETIC     metoprolol tartrate 25 MG tablet  Commonly known as:  LOPRESSOR      TAKE these medications       amLODipine 5 MG tablet  Commonly known as:  NORVASC  Take 1 tablet (5 mg total) by mouth daily.     aspirin 325 MG tablet  Take 325 mg by mouth daily.     atorvastatin 20 MG tablet  Commonly known as:  LIPITOR  Take 20 mg by mouth daily.     esomeprazole 20 MG capsule  Commonly known as:  NEXIUM  Take 20 mg by mouth daily at 12 noon.     lisinopril 20 MG tablet  Commonly known as:  PRINIVIL,ZESTRIL  Take 1 tablet (20 mg total) by mouth daily.     metoprolol succinate 100 MG 24 hr tablet  Commonly known as:  TOPROL-XL  Take 1 tablet (100 mg total) by mouth daily. Take with or immediately following a meal.     NASACORT ALLERGY 24HR 55 MCG/ACT Aero nasal inhaler   Generic drug:  triamcinolone  Place 2 sprays into the nose daily.     nitroGLYCERIN 0.4 MG SL tablet  Commonly known as:  NITROSTAT  Place 0.4 mg under the tongue every 5 (five) minutes as needed for chest pain.       Follow-up Information   Follow up with Rollene Rotunda, MD. (Our office will call you for appt.)    Specialty:  Cardiology   Contact information:   [address] [place] [address] [phone]         Time spent with patient to include physician time: 30 minutes Signed: Joni Reining 02/06/2013, 1:29 PM Co-Sign MD

## 2013-02-16 LAB — VMA AND HVA, 24 HR UR: Homovanillic Acid, 24H Ur: 5.6 mg/24 h (ref 1.6–7.5)

## 2013-08-24 ENCOUNTER — Other Ambulatory Visit: Payer: Self-pay | Admitting: Adult Health

## 2014-06-17 NOTE — H&P (Signed)
PATIENT NAMECRISTELA, [name] MR#:  [id number] DATE OF BIRTH:  [birth year]  DATE OF ADMISSION:  01/29/2013  PRIMARY CARE PHYSICIAN: None.   CHIEF COMPLAINT: Chest pain and congestion.   HISTORY OF PRESENTING ILLNESS: A 54 year old Caucasian female patient with history of hypertension, not on treatment, presents to the Emergency Room complaining of on and off chest pain of a week lasting about a few minutes. The patient has also had significant congestion in her sinuses and she felt like this was going into her chest. Has had a nonproductive cough. No PND, orthopnea, no edema.   Her chest pain is about 5/10, nonradiating, it is midsternal, not related to activity. Not pleuritic.   The patient's EKG shows normal sinus rhythm. No acute ST wave changes. Does have poor progression. Cardiac enzymes are normal. Blood pressure at the time of presentation to the Emergency Room was 266/140, presently is much improved at 133/65.   PAST MEDICAL HISTORY: Hypertension.   PAST SURGICAL HISTORY: None.   SOCIAL HISTORY: The patient does not smoke. Rare alcohol use. No illicit drugs. 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   FAMILY HISTORY: Cardiac disease in mother and father, no premature coronary artery disease in the family.   ALLERGIES: CEPHALEXIN, WHICH CAUSES ITCHING.    REVIEW OF SYSTEMS: CONSTITUTIONAL: No fever, fatigue, weakness.  EYES: No blurred vision, pain or redness.  ENT: No tinnitus, ear pain, hearing loss.  RESPIRATORY: No shortness of breath.   CARDIOVASCULAR: No chest pain. No edema.  GASTROINTESTINAL: No nausea, vomiting, diarrhea, abdominal pain.  GENITOURINARY: Denies hematuria, frequency.  ENDOCRINE: No polyuria, nocturia, thyroid problems.  HEMATOLOGIC/LYMPHATIC: No anemia, easy bruising, bleeding.  INTEGUMENTARY: No acne, rash, lesion.  MUSCULOSKELETAL: No joint pain, arthritis.  NEUROLOGIC: No focal numbness, weakness, seizures.  PSYCHIATRIC:  No anxiety,  depression.   HOME MEDICATIONS: The patient was on Diovan, hydrochlorothiazide in the past, but has not been on it for many months.   PHYSICAL EXAMINATION: VITAL SIGNS: Shows temperature of 99.3, pulse of 115, blood pressure 266/143, saturating 97% on room air.  GENERAL: Moderately-built Caucasian female patient, lying in bed in no acute distress.  PSYCHIATRIC: Alert and oriented x 3. Mood and affect appropriate. Judgment intact.  HEENT: Atraumatic, normocephalic. Oral mucosa moist and pink. Ears, nose normal. No pallor. No icterus. Pupils bilaterally equal and reactive to  light.  NECK: Supple. No thyromegaly or palpable lymph nodes. Trachea midline. No carotid bruit, no JVD.   CARDIOVASCULAR: S1, S2, with a systolic murmur. Peripheral pulses 2+. No edema.  RESPIRATORY: Normal work of breathing, basal crackles on the left side.  GASTROINTESTINAL: Soft abdomen, nontender. Bowel sounds present. No hepatosplenomegaly palpable.  SKIN: Warm and dry. No petechiae, rash, ulcers.  MUSCULOSKELETAL: No joint swelling, redness, effusion of the large joints. Normal muscle tone. NEUROLOGIC: Motor strength 5 out of 5 in upper and lower extremities. Sensation is intact all over.  LYMPHATIC: No cervical lymphadenopathy.   LABORATORY STUDIES: Show a BNP of 1600, glucose of 108, BUN 18, creatinine 1.06, sodium 139, potassium 3.3. AST, alkaline phosphatase, bilirubin normal. Troponin 0.03. WBC 9.3, hemoglobin 9.4, platelets of 343 with MCV of 74.   Urinalysis shows 2+ blood.   EKG shows LVH with poor R wave progression.   Chest x-ray shows mild cardiomegaly. No pulmonary edema.   ASSESSMENT AND PLAN: 1.  Accelerated hypertension with chest pain in a patient with history of hypertension, not on treatment. We will start the patient on lisinopril/hydrochlorothiazide. Discussed with  the patient regarding availability on $4.00 [facility] list. Also start on aspirin. Check HbA1c and fasting lipid profile. Discussed  the importance of compliance with patient. The patient does have a murmur on examination. We will get echocardiograms. Also considering her BNP is elevated, review echo for any congestive heart failure. We will consult cardiology for further input regarding the chest pain. Check two more sets of cardiac enzymes.  2.  Mild hypokalemia. Replace orally.  3.  Deep venous thrombosis prophylaxis with Lovenox.   CODE STATUS: Full code.   Time spent today on this case: 55 minutes.     ____________________________ Molinda BailiffSrikar R. Lenton Gendreau, MD srs:NTS D: 01/29/2013 05:20:09 ET T: 01/29/2013 05:33:56 ET JOB#: [id number]  cc: Wardell HeathSrikar R. Jeff Mccallum, MD, <Dictator> Orie FishermanSRIKAR R Adaisha Campise MD ELECTRONICALLY SIGNED 02/03/2013 14:09

## 2014-06-17 NOTE — Consult Note (Signed)
PATIENT NAMDietrich [name]:  [name], Tracy [name] MR#:  [id number] DATE OF BIRTH:  Oct 21, 1960  DATE OF CONSULTATION:  01/29/2013  REFERRING PHYSICIAN:   CONSULTING PHYSICIAN:  Laurier NancyShaukat A. Khan, MD  INDICATION FOR CONSULTATION: Chest pain.   HISTORY OF PRESENT ILLNESS: This is a 54 year old white female with a past medical history of hypertension, noncompliant with her medication, who came into the hospital with congestion, cough, sinus pain, and had chest pain for a few minutes, described as pressure-type associated with shortness of breath and 5/10 chest pain. Seemed more pleuritic with taking deep inspiration.   PAST MEDICAL HISTORY: Hypertension.   SOCIAL HISTORY: She denies EtOH abuse or smoking.   FAMILY HISTORY: Positive for coronary artery disease.   ALLERGIES: CEPHALEXIN.   PHYSICAL EXAMINATION: GENERAL: She is alert, oriented x3, in no acute distress right now.  VITAL SIGNS: Her blood pressure is 156/85, respirations 16, pulse 83, temperature 98.2.  NECK: No JVD.  LUNGS: Good air entry. No rales or rhonchi.  HEART: Regular rate and rhythm. Normal S1 and S2. No audible murmur.  ABDOMEN: Soft and nontender, positive bowel sounds.  EXTREMITIES: No pedal edema.  NEUROLOGIC: She appears to be intact.   LABORATORY AND DIAGNOSTICS: EKG shows sinus rhythm with LVH, left atrial enlargement, nonspecific ST-T changes.   Her initial troponin was 0.03. The second set came back 0.07. The rest of the labs are unremarkable. BNP is 1601.   ASSESSMENT AND PLAN: The patient has uncontrolled hypertension with atypical chest pain, very mildly elevated troponin, most likely due to uncontrolled hypertension and demand ischemia. Agree with starting the patient on hydrochlorothiazide and metoprolol and may add enalapril 10 mg once a day. We will watch the patient; however, there are no acute EKG changes. If the troponin goes further, may consider catheterization. Otherwise, will look at the echocardiogram and make sure  that ejection fraction is normal.   Thank you very much for the referral. ____________________________ Laurier NancyShaukat A. Khan, MD sak:sb D: 01/29/2013 08:56:46 ET T: 01/29/2013 09:12:41 ET JOB#: [id number]  cc: Laurier NancyShaukat A. Khan, MD, <Dictator> Laurier NancySHAUKAT A KHAN MD ELECTRONICALLY SIGNED 02/01/2013 8:34

## 2014-06-17 NOTE — Discharge Summary (Signed)
PATIENT NAMDietrich [name]:  [name], [name] MR#:  [id number] DATE OF BIRTH:  03-09-1960  DATE OF ADMISSION:  01/29/2013 DATE OF DISCHARGE:  02/02/2013  PRIMARY CARE PHYSICIAN: nonlocal  CONSULTATIONS: Dr. Welton FlakesKhan, cardiologist.   DISCHARGE DIAGNOSES:  1.  Non-ST elevated myocardial infarction. 2.  Hypertensive malignancy.  3.  Acute renal failure. 4.  Anemia.   CONDITION: Stable.   CODE STATUS: Full code.   HOME MEDICATIONS:  1.  Nitroglycerin 0.4 mg sublingual every 5 minutes p.r.n. for chest pain up to 3 times. 2.  Atorvastatin 20 mg p.o. daily.  3.  Hydrochlorothiazide/lisinopril 12.5 mg/20 mg p.o. tablets 2 tablets once a day.  4.  Lopressor 25 mg 2 tablets every 12 hours.  5.  Aspirin 325 mg p.o. daily.   DIET: Low sodium, low fat, low cholesterol diet.   ACTIVITY: As tolerated.   FOLLOWUP CARE: Follow with PCP within 1 to 2 weeks. Follow up with Dr. Welton FlakesKhan on December 16, Tuesday, 10:00 a.m.   PROCEDURE: Cardiac cath.   REASON FOR ADMISSION: Chest pain and congestion.    HOSPITAL COURSE: The patient is a [age] year old Caucasian female with a history of hypertension, not on treatment, who presented to the ED complaining of intermittent chest pain for 1 week with some congestion in her sinuses. The patient's blood pressure the ED was 260/140 and improved to 133/65. For detailed history and physical examination, please refer to the admission note dictated by Dr. Elpidio AnisSudini. On admission day, the patient's laboratory data showed BNP 1600, glucose 108, BUN 18, creatinine 1.06, potassium 3.3, sodium 139. Urinalysis negative. EKG did not show any ST-T changes. Chest x-ray mild cardiomegaly. No pulmonary edema.  1.  The patient was initially admitted for hypertensive malignancy. After admission, the patient has been treated with hydrochlorothiazide/lisinopril and Lopressor. Since the patient's blood pressure is still not very well controlled, we will increase to Lopressor 50 mg p.o. b.i.d.  2.  Non-STEMI.  The patient's troponin elevated to 0.07 and then to 1.0. The patient was diagnosed with Non-STEMI. Dr. Welton FlakesKhan evaluated the patient and suggested continuing aspirin, statin and beta blocker. Dr. Welton FlakesKhan did a cardiac cath yesterday, which showed global left ventricular function was normal, ejection fraction 55%, moderate disease in the LAD with a normal ejection fraction. Dr. Welton FlakesKhan suggested medical management. Continue current treatment.  In addition, the patient has acute renal failure since yesterday. The patient's BUN increased to 1.39 and creatinine increased to 26. The patient needs to follow her BMP as an outpatient.   After cardiac cath, the patient has no complaints. Her vital signs are stable. She is clinically stable and will be discharged to home today. I discussed the patient's discharge plan with the patient, case manager, nurse and Dr. Welton FlakesKhan.   TIME SPENT: About 38 minutes.  ____________________________ Shaune PollackQing Adan Beal, MD qc:aw D: 02/02/2013 14:46:28 ET T: 02/02/2013 15:20:49 ET JOB#: [id number]  cc: Shaune PollackQing Erubiel Manasco, MD, <Dictator> Shaune PollackQING Keyuana Wank MD ELECTRONICALLY SIGNED 02/03/2013 15:20

## 2015-03-06 IMAGING — CR DG CHEST 1V PORT
1 series · 1 of 1 positions shown · non-contrast
Comparison: None.

CLINICAL DATA: Nasal congestion.  Chest pain.

EXAM:
PORTABLE CHEST - 1 VIEW

[ap]
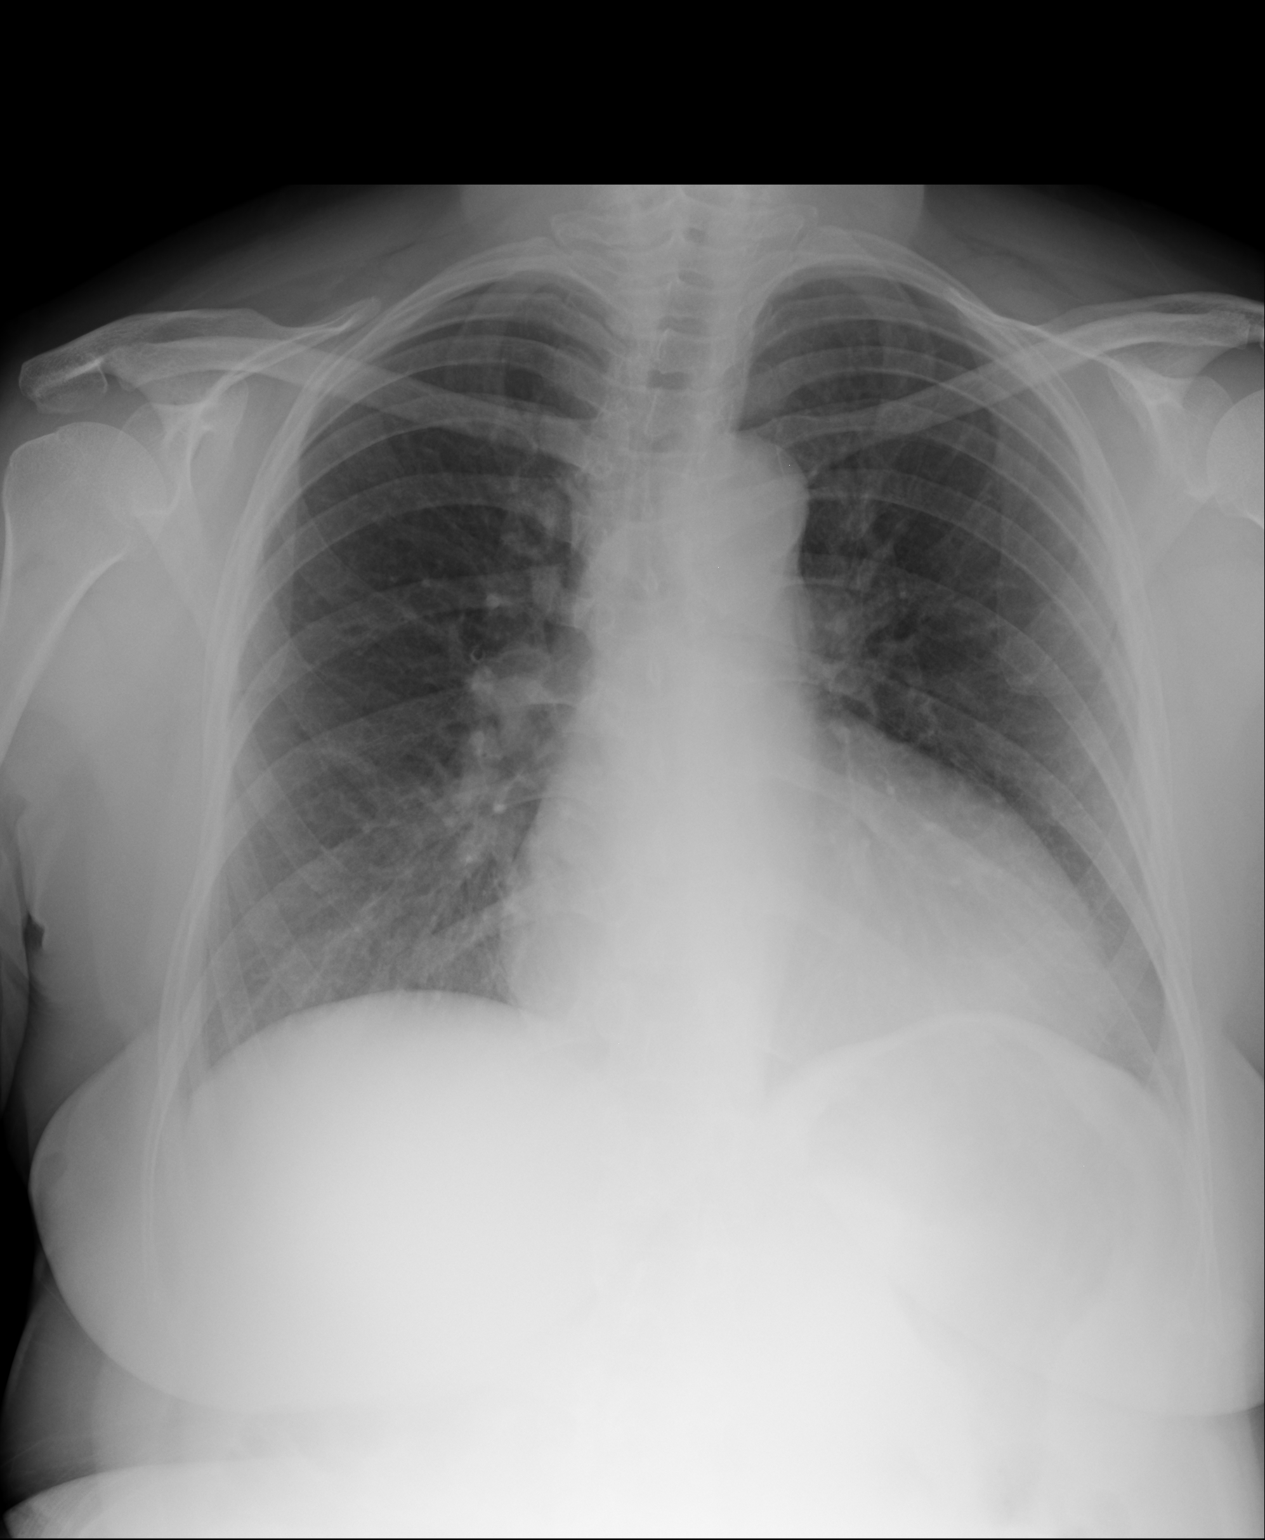

[1 of 1 positions shown; findings below may reference images not displayed]

FINDINGS: Midline trachea. Moderate cardiomegaly. Mediastinal contours
otherwise within normal limits. No pleural effusion or pneumothorax.
No congestive failure. Clear lungs.
IMPRESSION: Cardiomegaly without congestive failure.

## 2015-03-11 IMAGING — CR DG CHEST 2V
2 series · 2 of 2 positions shown · non-contrast
Comparison: Chest radiograph performed 01/29/2013

CLINICAL DATA: Mid chest pain; recent cardiac catheterization.

EXAM:
CHEST  2 VIEW

[w chest pa]
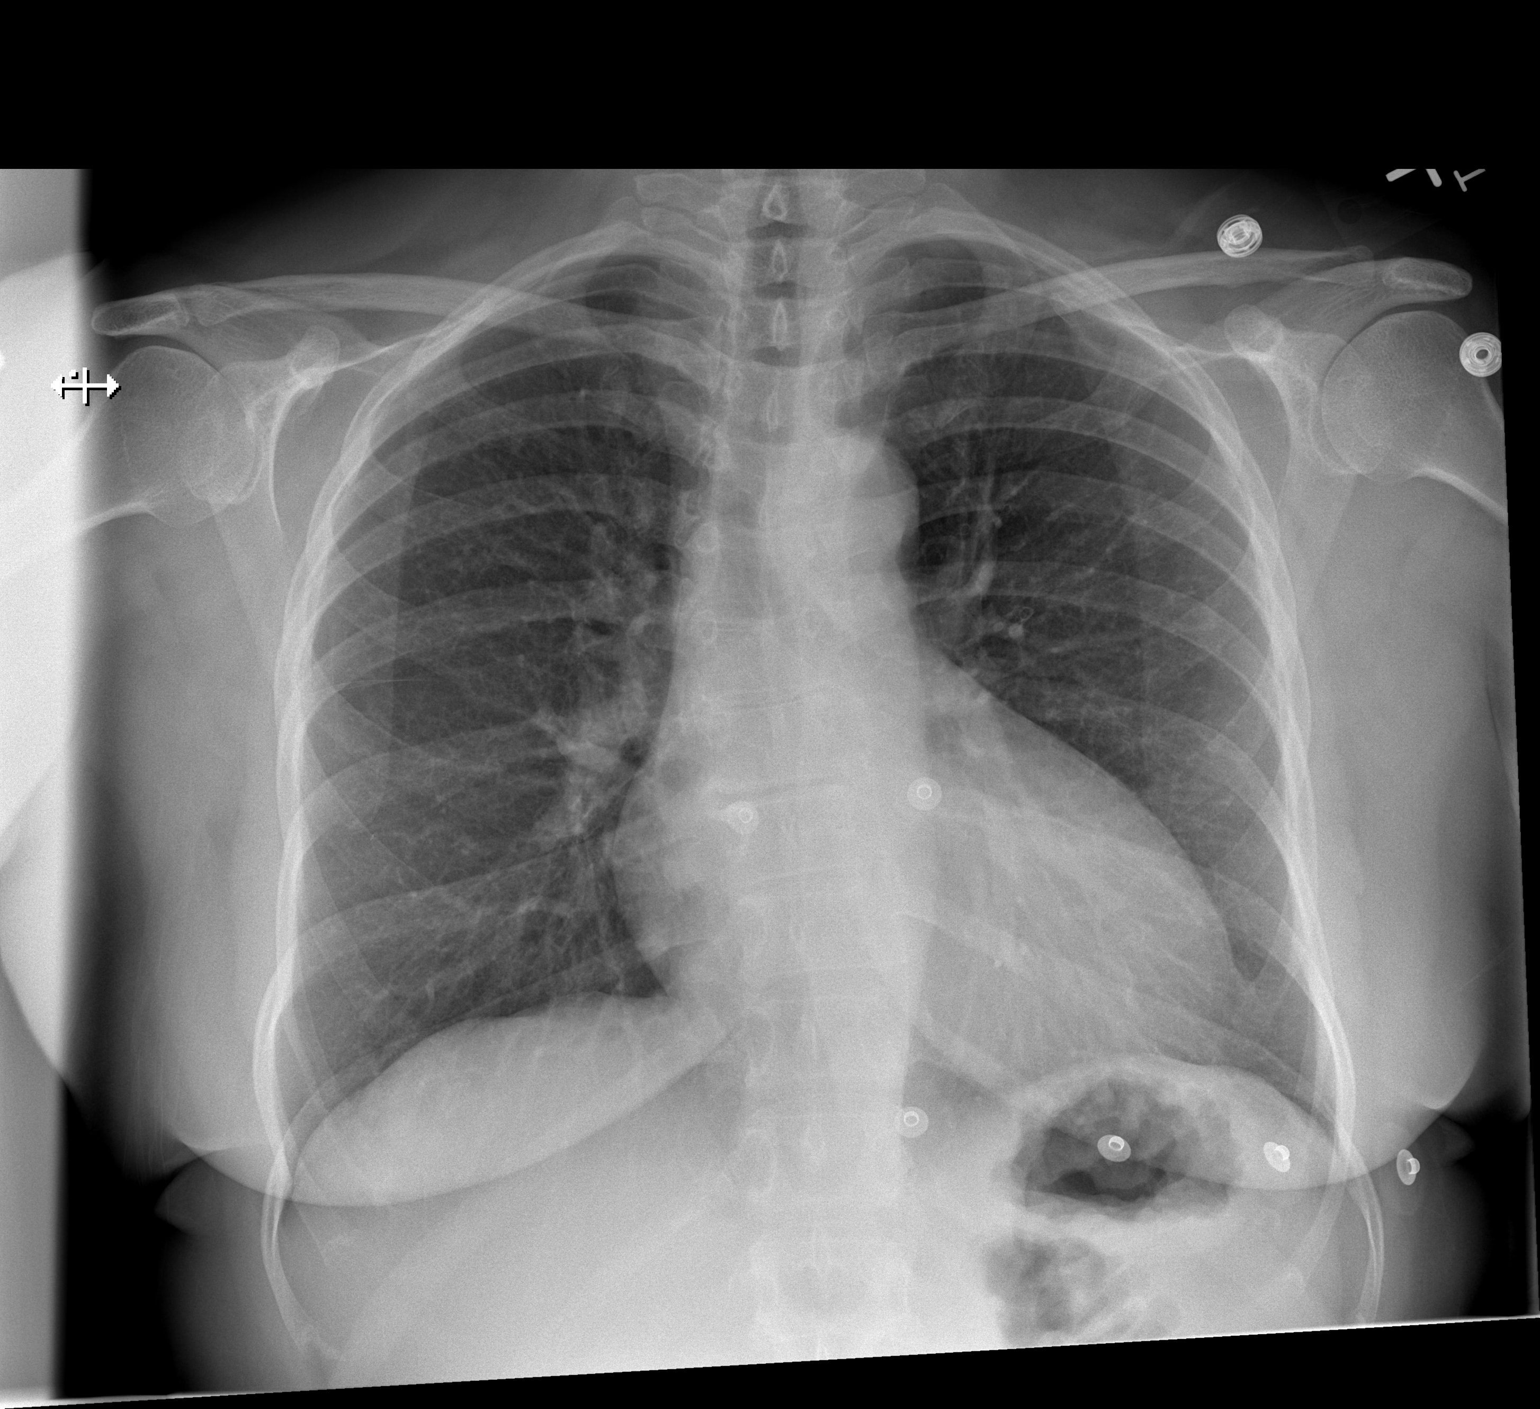

[w chest lat]
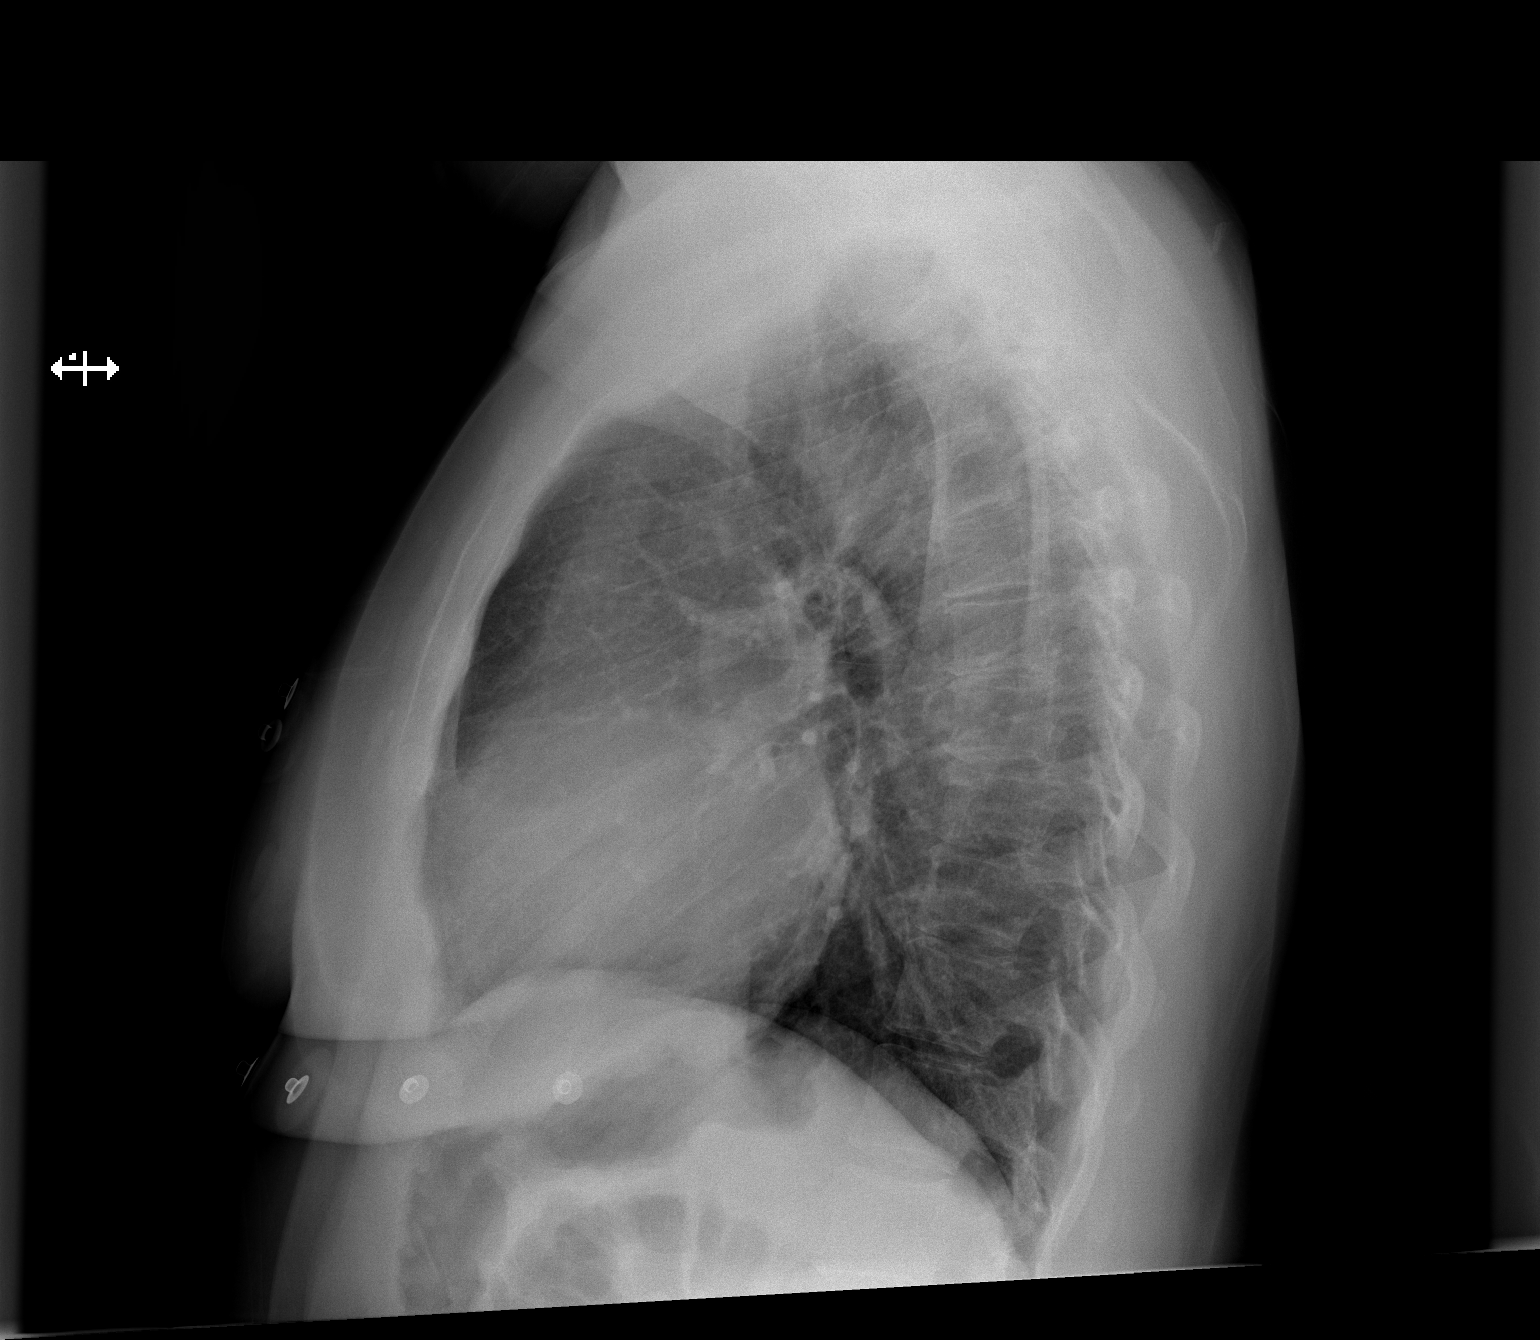

[2 of 2 positions shown; findings below may reference images not displayed]

FINDINGS: The lungs are well-aerated and clear. There is no evidence of focal
opacification, pleural effusion or pneumothorax.

The heart is borderline enlarged; the mediastinal contour is within
normal limits. No acute osseous abnormalities are seen.
IMPRESSION: No acute cardiopulmonary process seen; borderline cardiomegaly.
